# Patient Record
Sex: Male | Born: 1950 | Race: White | Hispanic: No | Marital: Single
Health system: Southern US, Community
[De-identification: ages and names within clinical notes are randomized; demographics above are authoritative.]

---

## 2014-04-06 ENCOUNTER — Inpatient Hospital Stay: Payer: Self-pay | Admitting: Internal Medicine

## 2014-04-06 LAB — CBC
HCT: 47.8 % (ref 40.0–52.0)
HGB: 15.6 g/dL (ref 13.0–18.0)
MCH: 33.8 pg (ref 26.0–34.0)
MCHC: 32.7 g/dL (ref 32.0–36.0)
MCV: 103 fL — AB (ref 80–100)
PLATELETS: 249 10*3/uL (ref 150–440)
RBC: 4.62 10*6/uL (ref 4.40–5.90)
RDW: 13 % (ref 11.5–14.5)
WBC: 8.4 10*3/uL (ref 3.8–10.6)

## 2014-04-06 LAB — COMPREHENSIVE METABOLIC PANEL
ALT: 59 U/L
AST: 71 U/L — AB (ref 15–37)
Albumin: 4 g/dL (ref 3.4–5.0)
Alkaline Phosphatase: 87 U/L
Anion Gap: 10 (ref 7–16)
BUN: 13 mg/dL (ref 7–18)
Bilirubin,Total: 0.6 mg/dL (ref 0.2–1.0)
CHLORIDE: 105 mmol/L (ref 98–107)
Calcium, Total: 9.6 mg/dL (ref 8.5–10.1)
Co2: 25 mmol/L (ref 21–32)
Creatinine: 1.21 mg/dL (ref 0.60–1.30)
EGFR (Non-African Amer.): >60
Glucose: 150 mg/dL — ABNORMAL HIGH (ref 65–99)
Osmolality: 282 (ref 275–301)
Potassium: 4.5 mmol/L (ref 3.5–5.1)
SODIUM: 140 mmol/L (ref 136–145)
TOTAL PROTEIN: 7.9 g/dL (ref 6.4–8.2)

## 2014-04-06 LAB — URINALYSIS, COMPLETE
Bacteria: NONE SEEN
Bilirubin,UR: NEGATIVE
Blood: NEGATIVE
Glucose,UR: NEGATIVE mg/dL (ref 0–75)
Leukocyte Esterase: NEGATIVE
Nitrite: NEGATIVE
PH: 9 (ref 4.5–8.0)
RBC,UR: 2 /HPF (ref 0–5)
SQUAMOUS EPITHELIAL: NONE SEEN
Specific Gravity: 1.02 (ref 1.003–1.030)
WBC UR: 1 /HPF (ref 0–5)

## 2014-04-09 LAB — BASIC METABOLIC PANEL
ANION GAP: 4 — AB (ref 7–16)
BUN: 10 mg/dL (ref 7–18)
CHLORIDE: 100 mmol/L (ref 98–107)
Calcium, Total: 8.8 mg/dL (ref 8.5–10.1)
Co2: 32 mmol/L (ref 21–32)
Creatinine: 1.06 mg/dL (ref 0.60–1.30)
EGFR (African American): >60
EGFR (Non-African Amer.): >60
Glucose: 104 mg/dL — ABNORMAL HIGH (ref 65–99)
Osmolality: 271 (ref 275–301)
POTASSIUM: 4.3 mmol/L (ref 3.5–5.1)
Sodium: 136 mmol/L (ref 136–145)

## 2014-04-09 LAB — CBC WITH DIFFERENTIAL/PLATELET
BASOS PCT: 0.1 %
Basophil #: 0 10*3/uL (ref 0.0–0.1)
EOS PCT: 1.8 %
Eosinophil #: 0.1 10*3/uL (ref 0.0–0.7)
HCT: 41.5 % (ref 40.0–52.0)
HGB: 14 g/dL (ref 13.0–18.0)
LYMPHS PCT: 14.4 %
Lymphocyte #: 1.1 10*3/uL (ref 1.0–3.6)
MCH: 34.6 pg — ABNORMAL HIGH (ref 26.0–34.0)
MCHC: 33.7 g/dL (ref 32.0–36.0)
MCV: 103 fL — ABNORMAL HIGH (ref 80–100)
MONO ABS: 0.8 x10 3/mm (ref 0.2–1.0)
Monocyte %: 10.5 %
NEUTROS ABS: 5.8 10*3/uL (ref 1.4–6.5)
Neutrophil %: 73.2 %
Platelet: 183 10*3/uL (ref 150–440)
RBC: 4.04 10*6/uL — ABNORMAL LOW (ref 4.40–5.90)
RDW: 12.5 % (ref 11.5–14.5)
WBC: 7.9 10*3/uL (ref 3.8–10.6)

## 2014-11-03 NOTE — Discharge Summary (Signed)
PATIENT NAME:  Devon Lowe, Devon Lowe MR#:  [id number] DATE OF BIRTH:  [birth year]  DATE OF ADMISSION:  04/06/2014 DATE OF DISCHARGE:  04/10/2014    DISCHARGE DIAGNOSES:  1.  Rib fractures with chest pain.  2.  Transverse process fractures of the spine with back pain.  3.  Constipation.   DISCHARGE MEDICATIONS: [facility]. Basically, will have: Dilaudid p.r.n., OxyContin 10 b.i.d. and Flector patch for his pain as well as Senna b.i.d. standing for constipation and p.r.n. MiraLax.   HISTORY AND PHYSICAL: Please see detailed history and physical done on admission.   HOSPITAL COURSE: The patient was admitted for pain as noted. Labs were unremarkable. CT scan showed of the above, with L1, L2 and L3 spinous process, as well as right posterior lateral tenth and eleventh rib, fractures. Small pulmonary nodules were also seen at approximately 4 mm. Radiology recommended repeat CT scan in a year, which will try to get scheduled prior to the patient's leaving, if the Radiology Department will agree to that. The patient's pain did improve. He was ambulating with approximately level 5-6 pain; none sitting still. Constipation was an issue despite Senna, MiraLax, etc. We are recommending enema; the patient has declined that so far, so we will discharge him on the above medications.   Please note, evaluating the patient, doing discharge forms, writing prescriptions, etc., took approximately 35 minutes today.    ____________________________ Marya AmslerMarshall W. Dareen PianoAnderson, MD mwa:MT D: 04/10/2014 08:06:55 ET T: 04/10/2014 08:16:08 ET JOB#: [id number]  cc: Marya AmslerMarshall W. Dareen PianoAnderson, MD, <Dictator> Lauro RegulusMARSHALL W ANDERSON MD ELECTRONICALLY SIGNED 04/10/2014 13:59

## 2014-11-03 NOTE — Consult Note (Signed)
Brief Consult Note: Diagnosis: Rib and vertebral fractures.   Orders entered.   Comments: Patient had fall down stairs.  Has fractured ribs and transverse processes of lumbar spine.  All injuries non-displaced.  Treatment will be symptomatic.  No orthopaedic surgery will be required.  Recommend lumbosacral corset.  Follow up with spine specialist after discharge.  May get OOB as tolerated.  Consider PT/OT evaluation.  Electronic Signatures: Juanell FairlyKrasinski, Tomie Spizzirri (MD)  (Signed 25-Sep-15 17:38)  Authored: Brief Consult Note   Last Updated: 25-Sep-15 17:38 by Juanell FairlyKrasinski, Rashaad Hallstrom (MD)

## 2014-11-03 NOTE — Consult Note (Signed)
PATIENT NAME:  Devon Lowe, Devon MR#:  [id number] DATE OF BIRTH:  [birth year]  DATE OF CONSULTATION:  04/07/2014  CONSULTING PHYSICIAN:  Kathreen DevoidKevin L. Glady Ouderkirk, MD  REASON FOR CONSULTATION: Right rib fractures and lumbar transverse process fractures.   HISTORY OF PRESENT ILLNESS: Mr. Read Lowe is a 64 year old male, who explains that he fell yesterday coming down cement stairs. His legs went out from underneath him and he landed on his right side on the stairs causing the above-noted injuries. [facility] in severe pain. CAT scans were done of his chest and lumbar spine upon arrival. He was seen today at the bedside with his daughter in the room. The patient states that his pain is currently 8/10 instead of 10/10 yesterday. He denies any neurologic deficits, including weakness or numbness in the lower extremities. He is able to take a deep breath and is not short of breath.    PHYSICAL EXAMINATION:  EXTREMITIES: Bilateral lower extremities demonstrate intact sensation to light touch, intact function in all muscle groups with 5/5 strength including the quadriceps, hamstring, anterior tibialis, gastrocsoleus, extensor hallucis longus and peroneal muscles. He had palpable pedal pulses. There is no deformity to the lower extremities.   RADIOLOGY: I reviewed the CAT scans of the chest and lumbar spine, along with the radiology reports. These demonstrate fractures of the right transverse processes L1, L2 and L3. There is no significant fracture displacement. The patient had no evidence of pneumothorax. The radiology report indicates fractures of the right posterior lateral 10th and 11th ribs without displacement. Small pulmonary nodules were noted, the largest being 4 mm. There is no evidence of lung contusion or laceration. There is no pulmonary effusion. There were no other acute findings.   ASSESSMENT: Right posterior rib fractures and nondisplaced transverse  fractures of L1, L2 and L3 on the right side.   PLAN: Mr. Read Lowe already has the lumbosacral corset I have ordered. His pain is being managed by the admitting medical service. He would benefit from a physical therapy evaluation. He would also benefit from follow up with a spine specialist upon discharge. I will sign off for now.     ____________________________ Kathreen DevoidKevin L. Marajade Lei, MD klk:TT D: 04/07/2014 12:49:07 ET T: 04/07/2014 14:00:01 ET JOB#: [id number]  cc: Kathreen DevoidKevin L. Liv Rallis, MD, <Dictator> Kathreen DevoidKEVIN L Iliyana Convey MD ELECTRONICALLY SIGNED 04/13/2014 16:11

## 2014-11-03 NOTE — H&P (Signed)
PATIENT NAME:  NIGIL, BRAMAN MR#:  [id number] DATE OF BIRTH:  [birth year]  DATE OF ADMISSION:  04/06/2014  PRIMARY CARE PHYSICIAN: Dr. Dareen Piano.   CHIEF COMPLAINT: Mechanical fall.   HISTORY OF PRESENT ILLNESS: This is a very pleasant 64 year old male with no past medical history. He was walking down his brick steps today, got caught in the stairs, and unfortunately fell and has suffered some rib fractures, as well as vertebral fractures. In the ER orthopedics was consulted and the orthopedic surgeon recommended admission with pain control. There is no indication for surgery at this time.   REVIEW OF SYSTEMS:  CONSTITUTIONAL: No fever, fatigue, weakness.  EYES: No blurred or double vision, glaucoma, cataracts.  ENT: No ear pain, hearing loss, seasonal allergies.  RESPIRATORY: No cough, wheezing, hemoptysis, COPD.  CARDIOVASCULAR: No chest pain, orthopnea, edema, arrhythmia, dyspnea on exertion.   GASTROINTESTINAL: No nausea, vomiting, diarrhea, abdominal pain, melena, or ulcers.   GENITOURINARY: No dysuria or hematuria.  ENDOCRINE: No polyuria or polydipsia.  HEMATOLOGY AND LYMPHATICS: No bleeding, swollen glands.  SKIN: No rash or lesions.  MUSCULOSKELETAL: Positive back pain.  NEUROLOGIC: No history of CVA, TIA, or seizures.  PSYCHIATRIC: No history of anxiety or depression.   PAST MEDICAL HISTORY: None.   MEDICATIONS: None.   ALLERGIES: ASPIRIN.   SOCIAL HISTORY: No tobacco, occasional alcohol. No IV drug use.   PAST SURGICAL HISTORY: The patient had left eye surgery for a tumor.   FAMILY HISTORY: Positive for diabetes.   PHYSICAL EXAMINATION: VITAL SIGNS: The patient is afebrile. Temperature 97.9, pulse 89, respirations 15, blood pressure 174/95, 99% on room air.  GENERAL: The patient is alert, oriented, in moderate distress from pain.   HEENT: Head is atraumatic. Pupils are round and reactive. Sclerae anicteric. Mucous membranes are moist. Oropharynx is clear.  NECK:  Supple without JVD, carotid bruit, or enlarged thyroid.  CARDIOVASCULAR: Regular rate and rhythm. No murmurs, gallops, or rubs.  PMI is not displaced.  LUNGS: Clear to auscultation without crackles, rales, rhonchi, or wheezing. Normal to percussion.  ABDOMEN: Bowel sounds are positive. Nontender, nondistended. No hepatosplenomegaly.  EXTREMITIES: No clubbing, cyanosis, or edema.  NEUROLOGIC: Cranial nerves II through XII are intact. There are no focal deficits.  He has no numbness or tingling down his lower extremities. His strength is about a 3/5 due to the pain. Babinski sign is downgoing.   LABORATORY DATA: Urinalysis is negative for LC or nitrites. White blood cells 8.4, hemoglobin 15.6, hematocrit 48, platelets 249,000. Sodium 140, potassium 4.5, chloride 105, bicarbonate 25, BUN 13, creatinine 1.21, glucose is 150, ALT 59, AST 71, total protein 7.9, albumin is 4.0, bilirubin 0.6, calcium is 9.6.   CT of the abdomen and pelvis showed a fracture of the right posterior lateral 10th and 11th ribs and the right L1, L2, L3 transverse processes. No significant fracture displacement. No evidence of a complication of the fractures. No lung contusion. No laceration. No pneumothorax or effusion. No liver laceration. No acute findings.   Small pulmonary nodules, largest measuring 4 mm. Followup CT is recommended at 1 year.   CT of the chest as mentioned above.   ASSESSMENT AND PLAN: This is a 64 year old male who unfortunately had a mechanical fall and has suffered rib fractures and transverse vertebral fractures.   1.  Rib fractures with right lateral 10th and 11th ribs, as well as right L1, L2, L3 transverse process fractures. As mentioned, orthopedics was called by the ER physician. At this time since the  patient has no neurological deficits, the patient will be admitted to the hospitalist service with pain control, physical therapy consultation, and supportive management. The patient will need a back  brace.  2.  Elevated blood pressure. No diagnosis of hypertension, likely secondary to his pain. Will control pain. Monitor blood pressure.  3.  Abnormal chest CT with a 4 mm nodule. The patient does not smoke but will need a repeat CT in about a year.  4.  The patient is a full code status.   TIME SPENT: Approximately 40 minutes.    ____________________________ Janyth ContesSital P. Juliene PinaMody, MD spm:at D: 04/06/2014 16:53:46 ET T: 04/06/2014 17:25:19 ET JOB#: [id number]  cc: Keishia Ground P. Juliene PinaMody, MD, <Dictator> Dr. Deirdre PippinsAnderson Sydney Azure P Mischelle Reeg MD ELECTRONICALLY SIGNED 04/06/2014 19:00

## 2017-07-19 ENCOUNTER — Other Ambulatory Visit: Payer: Self-pay | Admitting: Internal Medicine

## 2017-07-19 DIAGNOSIS — I482 Chronic atrial fibrillation, unspecified: Secondary | ICD-10-CM

## 2017-07-19 DIAGNOSIS — R1011 Right upper quadrant pain: Secondary | ICD-10-CM

## 2017-07-26 ENCOUNTER — Ambulatory Visit
Admission: RE | Admit: 2017-07-26 | Discharge: 2017-07-26 | Disposition: A | Discharge status: Home / Self Care | Payer: BLUE CROSS/BLUE SHIELD | Source: Ambulatory Visit | Attending: Internal Medicine | Admitting: Internal Medicine

## 2017-07-26 DIAGNOSIS — R1011 Right upper quadrant pain: Secondary | ICD-10-CM | POA: Diagnosis not present

## 2017-07-26 DIAGNOSIS — Z9049 Acquired absence of other specified parts of digestive tract: Secondary | ICD-10-CM | POA: Diagnosis not present

## 2017-08-03 ENCOUNTER — Ambulatory Visit
Admission: RE | Admit: 2017-08-03 | Discharge: 2017-08-03 | Disposition: A | Discharge status: Home / Self Care | Payer: BLUE CROSS/BLUE SHIELD | Source: Ambulatory Visit | Attending: Internal Medicine | Admitting: Internal Medicine

## 2017-08-03 DIAGNOSIS — I482 Chronic atrial fibrillation, unspecified: Secondary | ICD-10-CM

## 2017-08-03 DIAGNOSIS — R918 Other nonspecific abnormal finding of lung field: Secondary | ICD-10-CM | POA: Diagnosis not present

## 2017-08-03 DIAGNOSIS — I700 Atherosclerosis of aorta: Secondary | ICD-10-CM | POA: Insufficient documentation

## 2018-12-03 IMAGING — US US ABDOMEN COMPLETE
1 series · 13 of 25 positions shown · non-contrast
Comparison: CT scan of the abdomen and pelvis of [REDACTED]
2136

CLINICAL DATA: Right upper quadrant abdominal pain. Previous
cholecystectomy.

EXAM:
ABDOMEN ULTRASOUND COMPLETE

[Series 1: us abdomen complete · 0.22mm/px · 13 of 99 slices shown]
[im 1/99]
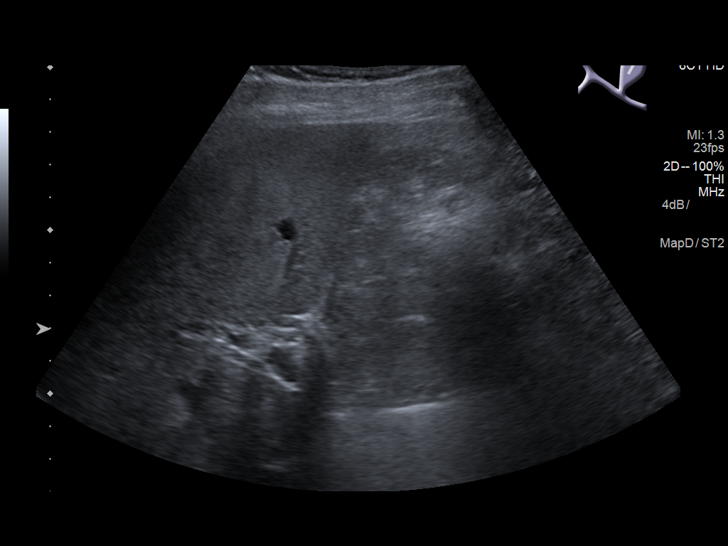
[im 9/99]
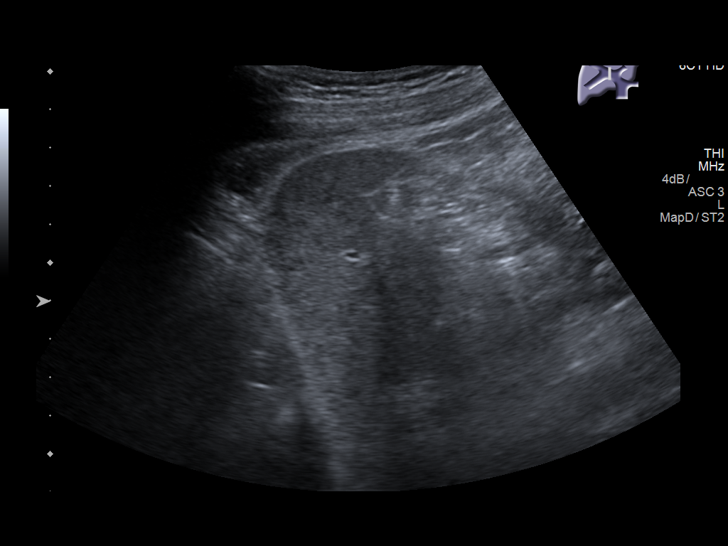
[im 17/99]
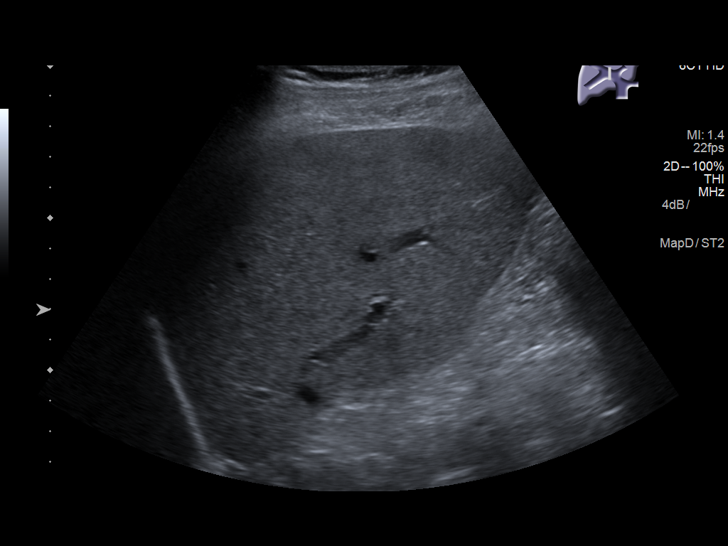
[im 25/99]
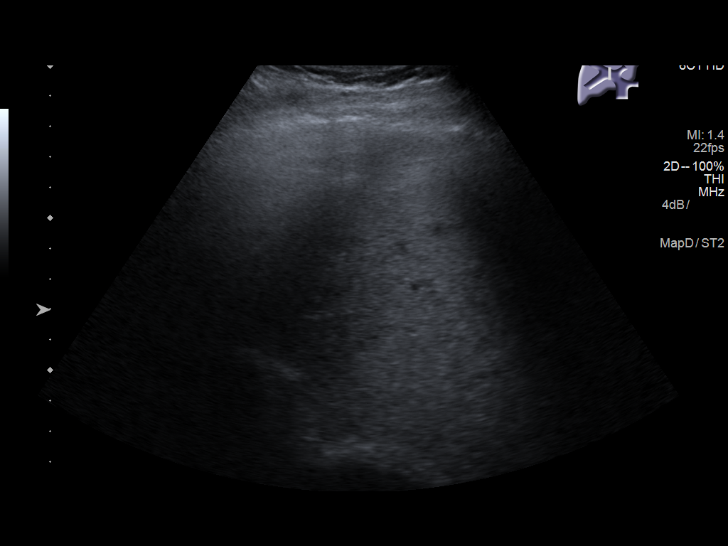
[im 33/99]
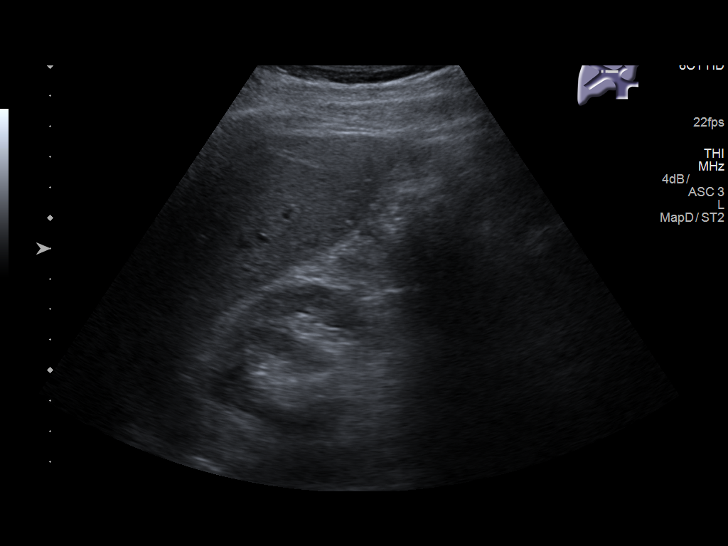
[im 41/99]
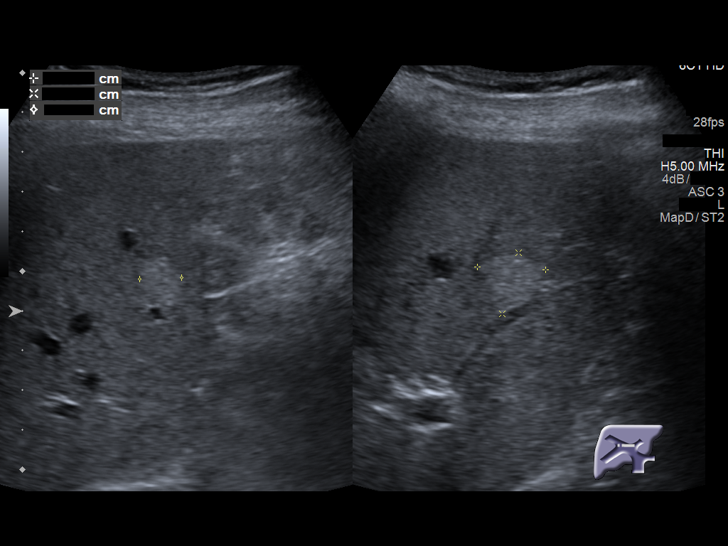
[im 50/99]
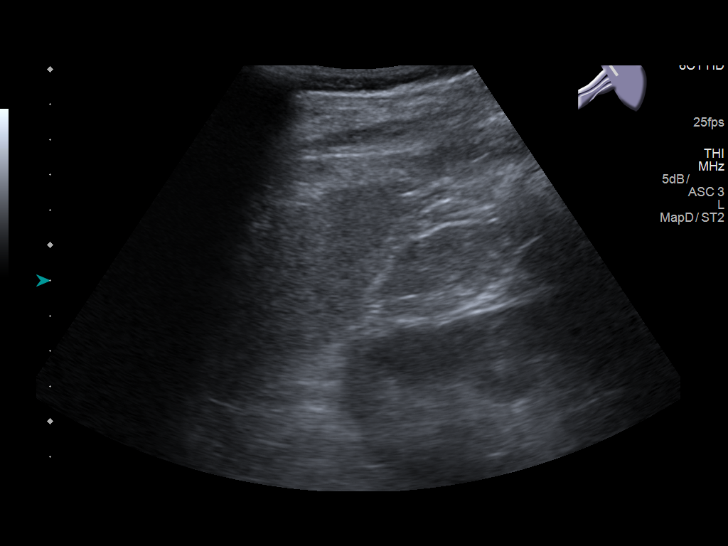
[im 58/99]
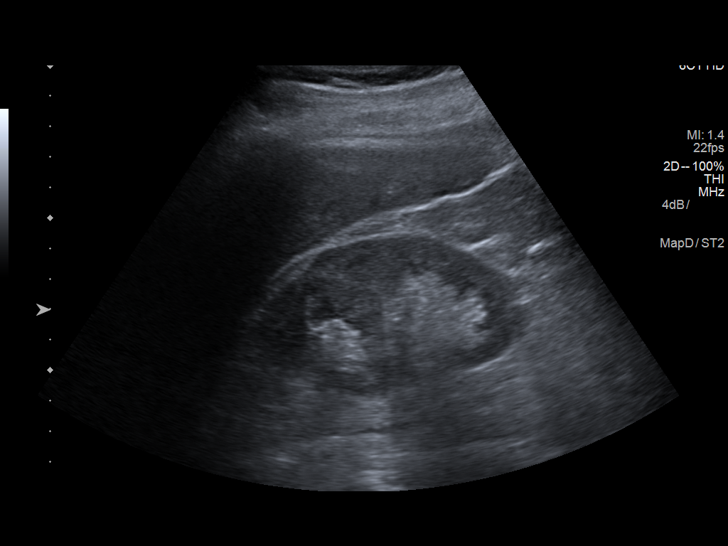
[im 66/99]
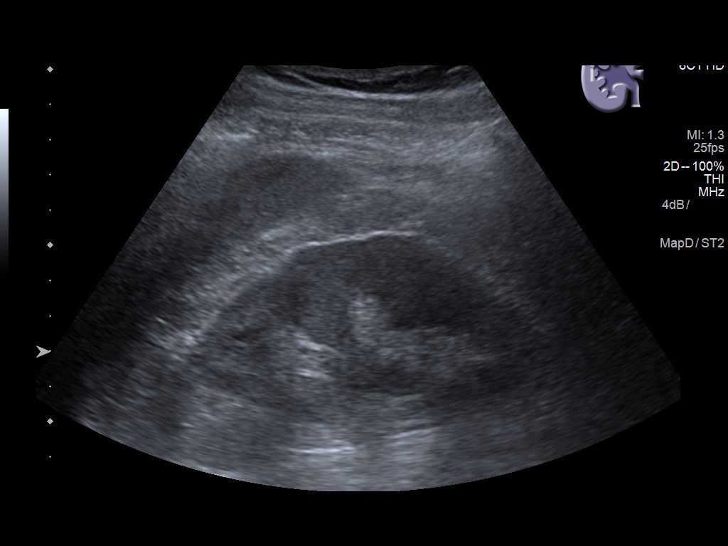
[im 74/99]
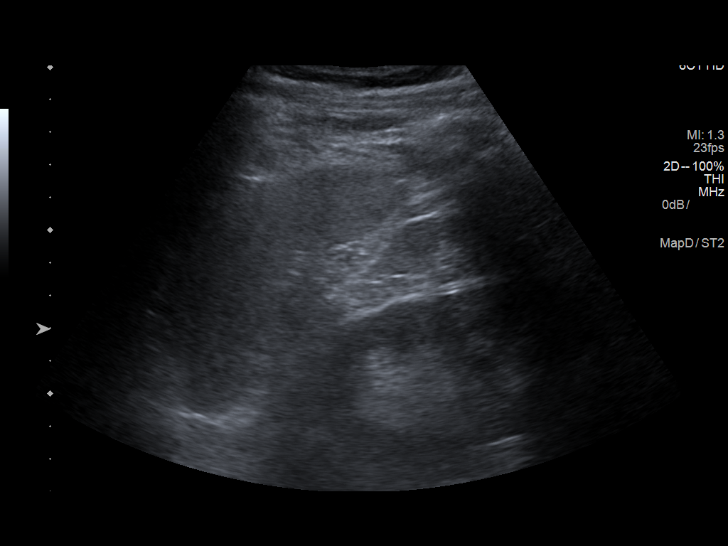
[im 82/99]
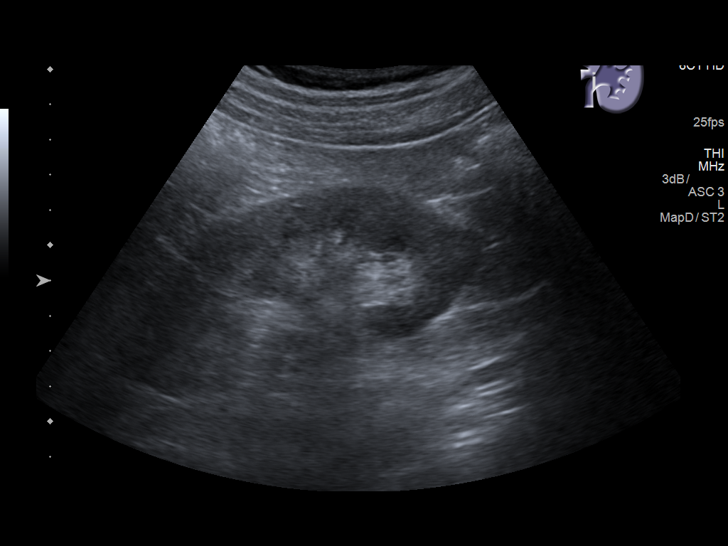
[im 90/99]
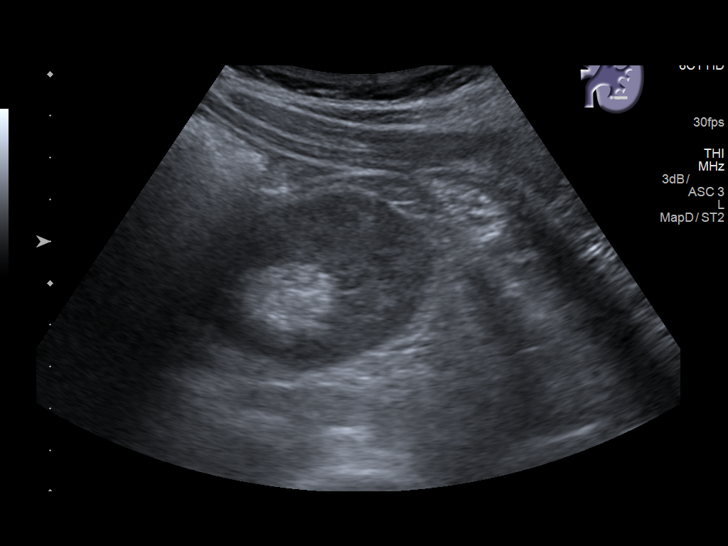
[im 99/99]
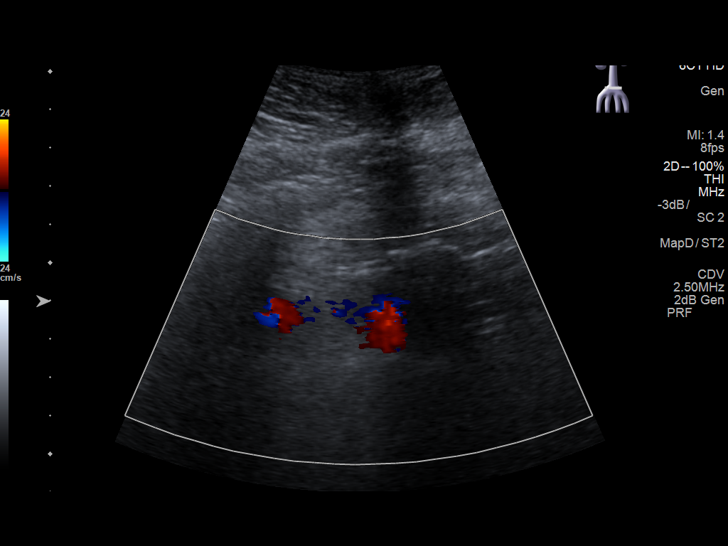

[13 of 25 positions shown; findings below may reference images not displayed]

FINDINGS: Gallbladder: The gallbladder is surgically absent.

Common bile duct: Diameter: 4.8 mm

Liver: The hepatic echotexture is mildly increased diffusely. In the
right hepatic lobe there is a hyperechoic focus corresponding to a
previously demonstrated CT finding most compatible with a hemangioma
measuring 5.8 x 4.9 x 4.5 cm. In addition a smaller right lobe
hyperechoic focus is observed measuring 1.7 x 1.6 x 1.1 cm. There is
no intrahepatic ductal dilation. Portal vein is patent on color
Doppler imaging with normal direction of blood flow towards the
liver.

IVC: No abnormality visualized.

Pancreas: Visualization of the pancreas is limited due to bowel gas.

Spleen: Size and appearance within normal limits.

Right Kidney: Length: 9.7 cm. Echogenicity within normal limits. No
mass or hydronephrosis visualized.

Left Kidney: Length: 10.8 cm. The renal cortical echotexture is
similar to that on the right. There is a 5 mm diameter cyst in the
lower pole cortex. There is no hydronephrosis.

Abdominal aorta: No aneurysm visualized.

Other findings: There is no ascites.
IMPRESSION: Decreased hepatic echotexture most compatible with fatty
infiltrative change. Probable hemangiomas in the right hepatic lobe.
Previous cholecystectomy. Limited visualization of the pancreas.

No acute abnormality is observed elsewhere within the abdomen.
